# Patient Record
Sex: Female | Born: 1945 | Hispanic: Yes | Marital: Married | Smoking: Never smoker
Health system: Southern US, Community
[De-identification: ages and names within clinical notes are randomized; demographics above are authoritative.]

---

## 2016-10-18 ENCOUNTER — Encounter (INDEPENDENT_AMBULATORY_CARE_PROVIDER_SITE_OTHER): Payer: Self-pay | Admitting: *Deleted

## 2016-11-20 ENCOUNTER — Encounter (INDEPENDENT_AMBULATORY_CARE_PROVIDER_SITE_OTHER): Payer: Self-pay | Admitting: Internal Medicine

## 2016-11-20 ENCOUNTER — Encounter (INDEPENDENT_AMBULATORY_CARE_PROVIDER_SITE_OTHER): Payer: Self-pay

## 2016-12-21 ENCOUNTER — Ambulatory Visit (INDEPENDENT_AMBULATORY_CARE_PROVIDER_SITE_OTHER): Payer: Self-pay | Admitting: Internal Medicine

## 2016-12-26 ENCOUNTER — Encounter (INDEPENDENT_AMBULATORY_CARE_PROVIDER_SITE_OTHER): Payer: Self-pay | Admitting: Internal Medicine

## 2016-12-26 ENCOUNTER — Ambulatory Visit (INDEPENDENT_AMBULATORY_CARE_PROVIDER_SITE_OTHER): Payer: Medicare HMO | Admitting: Internal Medicine

## 2016-12-26 ENCOUNTER — Encounter (INDEPENDENT_AMBULATORY_CARE_PROVIDER_SITE_OTHER): Payer: Self-pay | Admitting: *Deleted

## 2016-12-26 VITALS — BP 130/78 | HR 66 | Temp 97.1°F | Ht 61.0 in | Wt 222.8 lb

## 2016-12-26 DIAGNOSIS — I10 Essential (primary) hypertension: Secondary | ICD-10-CM | POA: Diagnosis not present

## 2016-12-26 DIAGNOSIS — E785 Hyperlipidemia, unspecified: Secondary | ICD-10-CM

## 2016-12-26 DIAGNOSIS — F329 Major depressive disorder, single episode, unspecified: Secondary | ICD-10-CM | POA: Diagnosis not present

## 2016-12-26 DIAGNOSIS — F32A Depression, unspecified: Secondary | ICD-10-CM

## 2016-12-26 DIAGNOSIS — K219 Gastro-esophageal reflux disease without esophagitis: Secondary | ICD-10-CM

## 2016-12-26 DIAGNOSIS — R140 Abdominal distension (gaseous): Secondary | ICD-10-CM

## 2016-12-26 MED ORDER — SIMETHICONE 180 MG PO CAPS: 180.0000 mg | ORAL_CAPSULE | Freq: Two times a day (BID) | ORAL | 0 refills | Status: DC | PRN

## 2016-12-26 MED ORDER — PANTOPRAZOLE SODIUM 40 MG PO TBEC: 40.0000 mg | DELAYED_RELEASE_TABLET | Freq: Every day | ORAL | 5 refills | Status: DC

## 2016-12-26 NOTE — Patient Instructions (Signed)
Upper abdominal ultrasound to be scheduled. Will request records of prior EGD and colonoscopy for review.

## 2016-12-26 NOTE — Progress Notes (Signed)
Presenting complaint;  Frequent bloating and belching.  History of present illness:  Patient is 71 year old female who is referred through courtesy of Dr. Leana RoeStacy Lahti for GI evaluation. Patient is accompanied by her husband Bary CastillaKalil. History is provided by the patient with help her for husband. Patient presents with complaints of bloating which she's had for many years but it has gotten worse lately. She says she has had problems with heartburn and regurgitation for more than 10 years. She states she's never had satisfactory control. Lately she's been burping after every meal and in between. However she does not experience nausea or vomiting. She has tried OTC medication without any relief. She denies abdominal pain diarrhea constipation melena or rectal bleeding. She generally has 1 formed stool daily. She had EGD and colonoscopy by Dr. Elder CyphersShiflett about 5 years ago. EGD revealed some irritation involving gastric mucosa but colonoscopy was normal. She is presently on ranitidine and does not feel that it is helping. She states she has gained a few pounds this year. She is trying to be more active. She goes to [place] twice a week and 3 days a week she walks for at least 30 minutes each time.  Current Medications: Outpatient Encounter Prescriptions as of 12/26/2016  Medication Sig  . ALPRAZolam (NIRAVAM) 0.25 MG dissolvable tablet Take 0.25 mg by mouth as needed for anxiety.  . Calcium Carbonate (CALCIUM 600 PO) Take by mouth daily.  Marland Kitchen. escitalopram (LEXAPRO) 20 MG tablet Take 20 mg by mouth daily.  . fexofenadine (ALLEGRA) 180 MG tablet Take 180 mg by mouth daily.  Marland Kitchen. losartan-hydrochlorothiazide (HYZAAR) 100-12.5 MG tablet Take 1 tablet by mouth daily.  . Multiple Vitamins-Minerals (PRESERVISION AREDS PO) Take by mouth daily.  . Probiotic Product (PROBIOTIC PO) Take by mouth. Patient alternates taking medication. Will take 1 day , skip a day , then take again...  . ranitidine (ZANTAC) 150 MG tablet Take  150 mg by mouth daily.  . rosuvastatin (CRESTOR) 10 MG tablet Take 10 mg by mouth. Patient takes twice a week.  . warfarin (COUMADIN) 4 MG tablet Take 4 mg by mouth daily.   No facility-administered encounter medications on file as of 12/26/2016.    Past medical history: Hypertension. Hyperlipidemia. Chronic GERD. Last EGD about 5 years ago. Depression. Obesity. History of left DVT. Abdominoplasty over 40 years ago. Last screening colonoscopy was normal about 5 years ago.   Allergies: No Known Allergies  Family history: Father had dementia and died at age 71. Mother had hypertension and asthma and lived to be in her 5270s. She lost one brother of colon carcinoma at age 71. He was diagnosed at age 71. 2 brothers died of coronary artery disease. There were both in their early 70s. She lost a sister secondary to CAD at age 71. She has 2 sisters living.   Social history: She is married and a comp in by her husband today. She is originally [place] but has been living in [place] several years. They have 2 children. Their son is 71 years old and daughter [id number]. That in good health. She worked in [employer] in business for few years but she's been retired for [employer]. She has never smoked cigars and does not drink alcohol.   Physical examination: Blood pressure 130/78, pulse 66, temperature (!) 97.1 F (36.2 C), temperature source Oral, height 5\' 1"  (1.549 m), weight 222 lb 12.8 oz (101.1 kg). Patient is alert and in no acute distress. She has hearing impairment. Conjunctiva  is pink. Sclera is nonicteric Oropharyngeal mucosa is normal. No neck masses or thyromegaly noted. Cardiac exam with regular rhythm normal S1 and S2. No murmur or gallop noted. Lungs are clear to auscultation. Abdomen is full. With upper abdominal scar. On palpation abdomen is soft and nontender without organomegaly or masses. No LE edema or clubbing noted.  Labs/studies Results: No lab data  available for review.     Assessment:  #1. Chronic GERD. Symptoms are not well controlled with dietary measures and ranitidine. She will benefit from trial with PPI. If she does not respond will consider diagnostic EGD.  #2. Chronic bloating. Differential diagnoses includes aerophagia secondary to GERD or anxiety or she could also have small bowel bacterial overgrowth. She does not have any other symptoms to support this diagnosis. Also need to rule out biliary tract disease.  #3. She is deemed to be average risk for CRC because her brother had Olan cancer at age 28. She is up-to-date on screening for CRC.   Recommendations:  Will request records of prior EGD and colonoscopy for review. Request prior bloodwork for review. Discontinue ranitidine. Pantoprazole 40 mg by mouth every morning. Phazyme 180 mg by mouth twice a day when necessary. Upper abdominal ultrasound. Patient will return for follow-up visit in 2 months.

## 2016-12-27 DIAGNOSIS — F32A Depression, unspecified: Secondary | ICD-10-CM | POA: Insufficient documentation

## 2016-12-27 DIAGNOSIS — I10 Essential (primary) hypertension: Secondary | ICD-10-CM | POA: Insufficient documentation

## 2016-12-27 DIAGNOSIS — F329 Major depressive disorder, single episode, unspecified: Secondary | ICD-10-CM | POA: Insufficient documentation

## 2016-12-27 DIAGNOSIS — E785 Hyperlipidemia, unspecified: Secondary | ICD-10-CM | POA: Insufficient documentation

## 2017-01-02 ENCOUNTER — Ambulatory Visit (HOSPITAL_COMMUNITY): Payer: Medicare HMO

## 2017-02-13 ENCOUNTER — Telehealth (INDEPENDENT_AMBULATORY_CARE_PROVIDER_SITE_OTHER): Payer: Self-pay | Admitting: Internal Medicine

## 2017-02-13 NOTE — Telephone Encounter (Signed)
Patient's spouse, Bary Castilla called and stated that his wife has an appointment on October 30 with Dr. Karilyn Cota .  He stated that he was supposed to call with a progress report, he stated that she is in the same state, no change.  He stated that she is pregnant with gas.  [phone]

## 2017-02-14 NOTE — Telephone Encounter (Signed)
Progress Report for Dr.Rehman

## 2017-02-16 ENCOUNTER — Other Ambulatory Visit (INDEPENDENT_AMBULATORY_CARE_PROVIDER_SITE_OTHER): Payer: Self-pay | Admitting: Internal Medicine

## 2017-02-16 MED ORDER — METRONIDAZOLE 250 MG PO TABS: 250.0000 mg | ORAL_TABLET | Freq: Three times a day (TID) | ORAL | 0 refills | Status: DC

## 2017-02-16 NOTE — Telephone Encounter (Signed)
Call returned. Talked with patient's husband. Will treat her with metronidazole 250 mg by mouth 3 times a day for 2 weeks. Patient is on warfarin. She will decrease dose to 2 mg daily. She will have INR checked by Dr. Tasia Catchings next week. She keep daily symptom diary while on antibiotics. Patient has office visit in one month.

## 2017-03-20 ENCOUNTER — Encounter (INDEPENDENT_AMBULATORY_CARE_PROVIDER_SITE_OTHER): Payer: Self-pay

## 2017-03-20 ENCOUNTER — Ambulatory Visit (INDEPENDENT_AMBULATORY_CARE_PROVIDER_SITE_OTHER): Payer: Medicare HMO | Admitting: Internal Medicine

## 2017-03-20 ENCOUNTER — Encounter (INDEPENDENT_AMBULATORY_CARE_PROVIDER_SITE_OTHER): Payer: Self-pay | Admitting: Internal Medicine

## 2017-03-20 VITALS — BP 128/72 | HR 65 | Temp 97.6°F | Resp 18 | Ht 61.0 in | Wt 218.2 lb

## 2017-03-20 DIAGNOSIS — R140 Abdominal distension (gaseous): Secondary | ICD-10-CM | POA: Diagnosis not present

## 2017-03-20 DIAGNOSIS — K219 Gastro-esophageal reflux disease without esophagitis: Secondary | ICD-10-CM

## 2017-03-20 MED ORDER — PANTOPRAZOLE SODIUM 40 MG PO TBEC: 40.0000 mg | DELAYED_RELEASE_TABLET | Freq: Every day | ORAL | 5 refills | Status: DC

## 2017-03-20 NOTE — Progress Notes (Signed)
Presenting complaint;  Follow-up for bloating and GERD.  Subjective:  Patient is 71 year old female who is here for scheduled visit accompanied by her husband.  She was last seen on 12/26/2016.  She was given 2-week supply of metronidazole.  She states it did not make any difference to her bloating.  She states heartburn is much better when she is not having regurgitation like she was before.  Her husband states she has burping spells usually in the morning lasting for a few minutes.  Her husband states sometimes her burps have an odor.  She is watching her diet and trying to walk more.  She has lost 4 pounds since her last visit. Her bowels move daily.   Current Medications: Outpatient Encounter Prescriptions as of 03/20/2017  Medication Sig  . ALPRAZolam (NIRAVAM) 0.25 MG dissolvable tablet Take 0.25 mg by mouth as needed for anxiety.  . Calcium Carbonate (CALCIUM 600 PO) Take by mouth daily.  Marland Kitchen escitalopram (LEXAPRO) 20 MG tablet Take 20 mg by mouth daily.  . fexofenadine (ALLEGRA) 180 MG tablet Take 180 mg by mouth daily.  Marland Kitchen FLUZONE HIGH-DOSE 0.5 ML injection TO BE ADMINISTERED BY PHARMACIST FOR IMMUNIZATION  . losartan-hydrochlorothiazide (HYZAAR) 100-12.5 MG tablet Take 1 tablet by mouth daily.  . metroNIDAZOLE (FLAGYL) 250 MG tablet Take 1 tablet (250 mg total) by mouth 3 (three) times daily.  . Multiple Vitamins-Minerals (PRESERVISION AREDS PO) Take by mouth daily.  . pantoprazole (PROTONIX) 40 MG tablet Take 1 tablet (40 mg total) by mouth daily before breakfast.  . Probiotic Product (PROBIOTIC PO) Take by mouth. Patient alternates taking medication. Will take 1 day , skip a day , then take again...  . rosuvastatin (CRESTOR) 10 MG tablet Take 10 mg by mouth. Patient takes twice a week.  . Simethicone (PHAZYME) 180 MG CAPS Take 1 capsule (180 mg total) by mouth 2 (two) times daily as needed.  . warfarin (COUMADIN) 4 MG tablet Take 4 mg by mouth daily.   No facility-administered  encounter medications on file as of 03/20/2017.      Objective: Blood pressure 128/72, pulse 65, temperature 97.6 F (36.4 C), temperature source Oral, resp. rate 18, height 5\' 1"  (1.549 m), weight 218 lb 3.2 oz (99 kg). Patient is alert and in no acute distress. She has hearing impairment. Conjunctiva is pink. Sclera is nonicteric Oropharyngeal mucosa is normal. No neck masses or thyromegaly noted. Cardiac exam with regular rhythm normal S1 and S2. No murmur or gallop noted. Lungs are clear to auscultation. Abdomen is full.  On palpation it is soft and nontender without organomegaly or or masses.  Percussion note is normal. No LE edema or clubbing noted.   Assessment:  #1.  Chronic GERD.  She is doing better with therapy.  If she is successful in losing weight.  May consider changing dose to every other day on her next visit.  #2.  Bloating.  She did not have significant benefit with metronidazole.  Suspect bloating is primarily due to weight gain.  It is interesting to note that she had bloating over 5 years ago when she had a EGD and colonoscopy.  #3.  Her risk for CRC would appear to be average since her brother was in his 85s at the time of diagnosis.  Therefore she could wait another 5 years before next screening colonoscopy.   Plan:  Continue pantoprazole at 40 mg every morning. Prescription sent to her pharmacy. Patient counseled for the need to lose weight. Goal is  for her tolose over 10 pounds by the time of her next visit in 6 months.

## 2017-03-20 NOTE — Patient Instructions (Addendum)
Notify if burping gets worse. Goal for you is lose at least 12 pounds before next office visit in 6 months.

## 2017-09-25 ENCOUNTER — Ambulatory Visit (INDEPENDENT_AMBULATORY_CARE_PROVIDER_SITE_OTHER): Payer: Medicare PPO | Admitting: Internal Medicine

## 2017-09-25 ENCOUNTER — Encounter (INDEPENDENT_AMBULATORY_CARE_PROVIDER_SITE_OTHER): Payer: Self-pay | Admitting: Internal Medicine

## 2017-09-25 VITALS — BP 138/84 | HR 62 | Temp 97.9°F | Resp 18 | Ht 61.0 in | Wt 219.0 lb

## 2017-09-25 DIAGNOSIS — K219 Gastro-esophageal reflux disease without esophagitis: Secondary | ICD-10-CM

## 2017-09-25 MED ORDER — PANTOPRAZOLE SODIUM 40 MG PO TBEC: 40.0000 mg | DELAYED_RELEASE_TABLET | Freq: Every day | ORAL | 11 refills | Status: AC

## 2017-09-25 NOTE — Patient Instructions (Signed)
Call if pantoprazole stops working. 

## 2017-09-25 NOTE — Progress Notes (Signed)
Presenting complaint;  Follow-up for GERD.  Subjective:  Patient is 72 year old African-American female who is here for scheduled visit accompanied by her husband.  She was last seen on 03/20/2017.  She says she is doing much better.  She has had very little bloating since her last visit and she has not taken Phazyme.  She says pantoprazole is working and she rarely has heartburn.  Last time she had heartburn was when she ate pizza.  She denies dysphagia nausea vomiting abdominal pain melena or rectal bleeding.  Her bowels move daily.  She has not lost any weight since her last visit.  She states she did lose 5 pounds until over 2 months ago when she developed flu and developed breathing difficulty and had to be treated with antibiotic and prednisone.  She gained 5 pounds back. She is watching her diet and walking some.  She states she will lose at least 10 pounds by the time of next visit.  Current Medications: Outpatient Encounter Medications as of 09/25/2017  Medication Sig  . ALPRAZolam (NIRAVAM) 0.25 MG dissolvable tablet Take 0.25 mg by mouth as needed for anxiety.  . Calcium Carbonate (CALCIUM 600 PO) Take by mouth daily.  Marland Kitchen escitalopram (LEXAPRO) 20 MG tablet Take 20 mg by mouth daily.  . fexofenadine (ALLEGRA) 180 MG tablet Take 180 mg by mouth daily.  Marland Kitchen FLUZONE HIGH-DOSE 0.5 ML injection TO BE ADMINISTERED BY PHARMACIST FOR IMMUNIZATION  . losartan-hydrochlorothiazide (HYZAAR) 100-12.5 MG tablet Take 1 tablet by mouth daily.  . Multiple Vitamins-Minerals (PRESERVISION AREDS PO) Take by mouth daily.  . pantoprazole (PROTONIX) 40 MG tablet Take 1 tablet (40 mg total) by mouth daily before breakfast.  . Probiotic Product (PROBIOTIC PO) Take by mouth. Patient alternates taking medication. Will take 1 day , skip a day , then take again...  . rosuvastatin (CRESTOR) 10 MG tablet Take 10 mg by mouth. Patient takes twice a week.  . Simethicone (PHAZYME) 180 MG CAPS Take 1 capsule (180 mg total)  by mouth 2 (two) times daily as needed.  . warfarin (COUMADIN) 4 MG tablet Take 4 mg by mouth daily.   No facility-administered encounter medications on file as of 09/25/2017.      Objective: Blood pressure 138/84, pulse 62, temperature 97.9 F (36.6 C), temperature source Oral, resp. rate 18, height  (1.549 m), weight 219 lb (99.3 kg). Patient is alert and in no acute distress. Conjunctiva is pink. Sclera is nonicteric Oropharyngeal mucosa is normal. No neck masses or thyromegaly noted. Cardiac exam with regular rhythm normal S1 and S2. No murmur or gallop noted. Lungs are clear to auscultation. Abdomen is full but symmetrical soft and nontender with organomegaly or masses. No LE edema or clubbing noted.    Assessment:  #1.  GERD.  She is doing well with therapy.  If she is able to lose weight she may be able to decrease PPI dose to every other day or so.  Plan:  New prescription given for pantoprazole for 30 days with 11 refills. Patient encouraged to watch calorie intake and walk regularly. Unless she has problems she will return for office visit in 1 year.

## 2018-09-30 ENCOUNTER — Encounter (INDEPENDENT_AMBULATORY_CARE_PROVIDER_SITE_OTHER): Payer: Self-pay | Admitting: Internal Medicine

## 2018-09-30 ENCOUNTER — Encounter (INDEPENDENT_AMBULATORY_CARE_PROVIDER_SITE_OTHER): Payer: Self-pay | Admitting: *Deleted

## 2018-09-30 ENCOUNTER — Other Ambulatory Visit (INDEPENDENT_AMBULATORY_CARE_PROVIDER_SITE_OTHER): Payer: Self-pay | Admitting: *Deleted

## 2018-09-30 ENCOUNTER — Ambulatory Visit (INDEPENDENT_AMBULATORY_CARE_PROVIDER_SITE_OTHER): Payer: Medicare PPO | Admitting: Internal Medicine

## 2018-09-30 ENCOUNTER — Other Ambulatory Visit: Payer: Self-pay

## 2018-09-30 VITALS — BP 168/80 | HR 86 | Temp 99.0°F | Resp 18 | Ht 61.0 in | Wt 219.9 lb

## 2018-09-30 DIAGNOSIS — K219 Gastro-esophageal reflux disease without esophagitis: Secondary | ICD-10-CM | POA: Diagnosis not present

## 2018-09-30 DIAGNOSIS — R142 Eructation: Secondary | ICD-10-CM

## 2018-09-30 DIAGNOSIS — K580 Irritable bowel syndrome with diarrhea: Secondary | ICD-10-CM | POA: Diagnosis not present

## 2018-09-30 MED ORDER — LOPERAMIDE HCL 2 MG PO CAPS: 2.0000 mg | ORAL_CAPSULE | Freq: Every day | ORAL | 5 refills | Status: AC | PRN

## 2018-09-30 MED ORDER — SIMETHICONE 180 MG PO CAPS: 180.0000 mg | ORAL_CAPSULE | Freq: Three times a day (TID) | ORAL | 0 refills | Status: AC | PRN

## 2018-09-30 NOTE — Progress Notes (Signed)
Presenting complaint;  Follow-up for GERD. Burping and bloating.  Database and subjective:  Patient is 73 year old female who is here for scheduled visit.  She is accompanied by her husband. She was last seen in May 2019 and was doing well. Patient was seen by her primary care physician and complained of bloating and intractable burping.  Pantoprazole was stopped and she states her burping got worse and she has gone back on it 3 days ago and feels some better.  Regarding her burping she was also treated with metronidazole.  She took 10 days the first time and stopped after 5 days on second go around.  She reports no improvement. Her husband states that she burps numerous times during the daytime.  She does not burp when she is sleeping.  She does not experience heartburn or regurgitation.  She denies abdominal pain.  She states she had abdominoplasty 40 years ago and she wonders if her burping is anything to do with that surgery. Her bowels used to be regular but over the last 2 months she has had postprandial bowel movement and some of her stools are loose.  She denies rectal bleeding fever or chills.  She does not recall that diarrhea was any better when she was on metronidazole. She says she has gained few pounds because she has not been able to walk for the last 3 weeks because she pulled a muscle.  She has been taking baclofen.  She is to take her last pill today.  Her husband states that she has developed some rash over chest.  Her back pain is much better.   Current Medications: Outpatient Encounter Medications as of 09/30/2018  Medication Sig  . ALPRAZolam (NIRAVAM) 0.25 MG dissolvable tablet Take 0.25 mg by mouth as needed for anxiety.  . Calcium Carbonate (CALCIUM 600 PO) Take by mouth daily.  Marland Kitchen. escitalopram (LEXAPRO) 20 MG tablet Take 20 mg by mouth daily.  . fexofenadine (ALLEGRA) 180 MG tablet Take 180 mg by mouth daily.  Marland Kitchen. FLUZONE HIGH-DOSE 0.5 ML injection TO BE ADMINISTERED BY  PHARMACIST FOR IMMUNIZATION  . losartan-hydrochlorothiazide (HYZAAR) 100-12.5 MG tablet Take 1 tablet by mouth daily.  . Multiple Vitamins-Minerals (PRESERVISION AREDS PO) Take by mouth daily.  . pantoprazole (PROTONIX) 40 MG tablet Take 1 tablet (40 mg total) by mouth daily before breakfast.  . Probiotic Product (PROBIOTIC PO) Take by mouth. Patient alternates taking medication. Will take 1 day , skip a day , then take again...  . rosuvastatin (CRESTOR) 10 MG tablet Take 10 mg by mouth at bedtime. Patient takes twice a week.   . warfarin (COUMADIN) 4 MG tablet Take 4 mg by mouth daily.   No facility-administered encounter medications on file as of 09/30/2018.      Objective: Blood pressure (!) 168/80, pulse 86, temperature 99 F (37.2 C), temperature source Oral, resp. rate 18, height 5\' 1"  (1.549 m), weight 219 lb 14.4 oz (99.7 kg). Patient is alert and in no acute distress. Conjunctiva is pink. Sclera is nonicteric Oropharyngeal mucosa is normal. No neck masses or thyromegaly noted. Cardiac exam with regular rhythm normal S1 and S2. No murmur or gallop noted. Lungs are clear to auscultation. Abdomen is full and symmetrical.  She has a long upper midline scar which extends to below the level of umbilicus.  Bowel sounds are normal.  On palpation abdomen is soft.  Percussion note is normal.  No organomegaly or masses. No LE edema or clubbing noted.   Assessment:  #1.  Intractable burping.  It is unclear if this symptom is due to gastritis or pharyngeal reflux disease aerophagia or she has altered anatomy to her upper GI tract or small bowel.  Her burping is not associated with nausea vomiting or abdominal pain and it may turn out to be aerophagia as a likely etiology.  GERD could also be contributing to her burping.  She is some better since she went back on pantoprazole 3 days ago.  #2.  GERD.  She does not have typical symptoms.  Will leave her on pantoprazole for now.  It remains to be  seen if she has a large hiatal hernia.  She may eventually need pH study to determine if she is refluxing or not.  #3.  Bloating.  Abdominal exam does not reveal tympanic abdomen.  I wonder if bloating is due to weight issues.  #4. irritable bowel syndrome.  She has typical symptoms.  She states she had "stomach virus" few months ago.  Therefore IBS may be postinfectious and I believe it should gradually improve and in the meantime we will treat her symptomatically.  Plan:  Schedule upper GI series with small bowel follow-through. Imodium OTC 2 mg p.o. daily PRN. Phazyme 180 mg by mouth after each meal for 1 week and thereafter on as-needed basis if it helps with burping and bloating. Patient will stop baclofen. If her rash gets worse she will contact her primary care physician. Office visit in 6 months.

## 2018-09-30 NOTE — Patient Instructions (Addendum)
Imodium OTC 2 mg every daily  as needed. Take Phazyme 3 times a day for 1 week and thereafter on as-needed basis.

## 2018-10-21 ENCOUNTER — Ambulatory Visit (HOSPITAL_COMMUNITY)
Admission: RE | Admit: 2018-10-21 | Discharge: 2018-10-21 | Disposition: A | Discharge status: Home / Self Care | Payer: Medicare PPO | Source: Ambulatory Visit | Attending: Internal Medicine | Admitting: Internal Medicine

## 2018-10-21 ENCOUNTER — Other Ambulatory Visit: Payer: Self-pay

## 2018-10-21 DIAGNOSIS — R142 Eructation: Secondary | ICD-10-CM | POA: Diagnosis not present

## 2018-10-28 ENCOUNTER — Ambulatory Visit (INDEPENDENT_AMBULATORY_CARE_PROVIDER_SITE_OTHER): Payer: Medicare PPO | Admitting: Internal Medicine

## 2019-04-08 ENCOUNTER — Ambulatory Visit (INDEPENDENT_AMBULATORY_CARE_PROVIDER_SITE_OTHER): Payer: Medicare PPO | Admitting: Internal Medicine

## 2020-12-15 IMAGING — RF UPPER GI W/ SMALL BOWEL
13 series · 13 of 13 positions shown · non-contrast
Comparison: None

CLINICAL DATA: Intractable chronic burping, bloating

EXAM:
UPPER GI SERIES WITH SMALL BOWEL FOLLOW-THROUGH
FLUOROSCOPY TIME:  Fluoroscopy Time:  2 minutes 21 seconds
Radiation Exposure Index (if provided by the fluoroscopic device):
39.6 mGy
Number of Acquired Spot Images: 5 plus multiple fluoroscopic screen
captures
TECHNIQUE: Combined double contrast and single contrast upper GI series using
effervescent crystals, thick barium, and thin barium. Subsequently,
serial images of the small bowel were obtained including spot views
of the terminal ileum.

[Series 1: cp_standard · 0.27mm/px · 1 of 1 slices shown (1 of 13)]
[im 1/1]
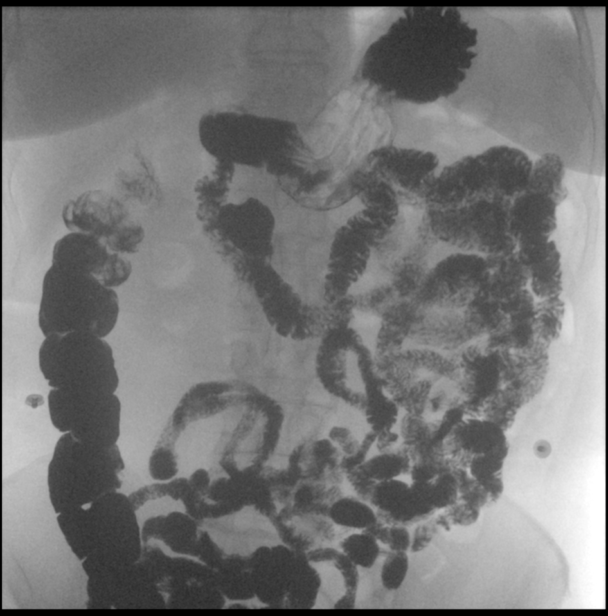

[Series 2: cp_standard · 0.27mm/px · 1 of 1 slices shown (2 of 13)]
[im 1/1]
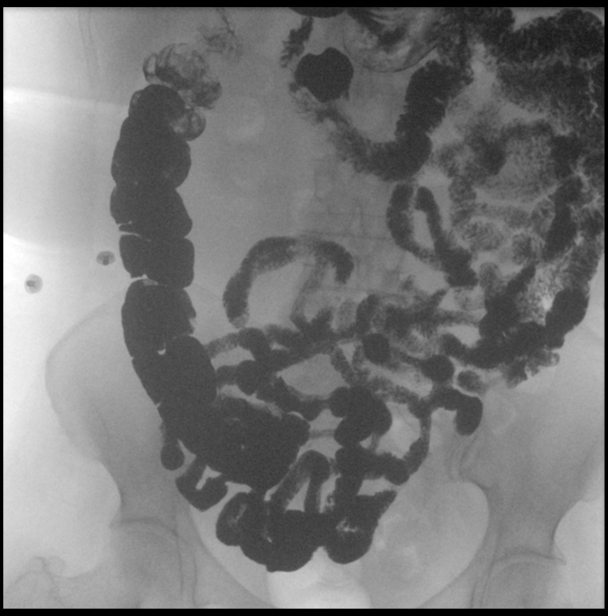

[Series 3: cp_standard · 0.18mm/px · 1 of 1 slices shown (3 of 13)]
[im 1/1]
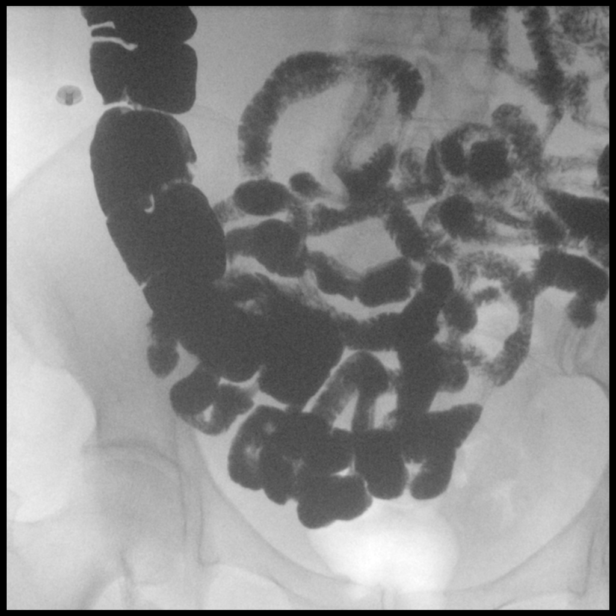

[Series 4: cp_standard · 0.18mm/px · 1 of 1 slices shown (4 of 13)]
[im 1/1]
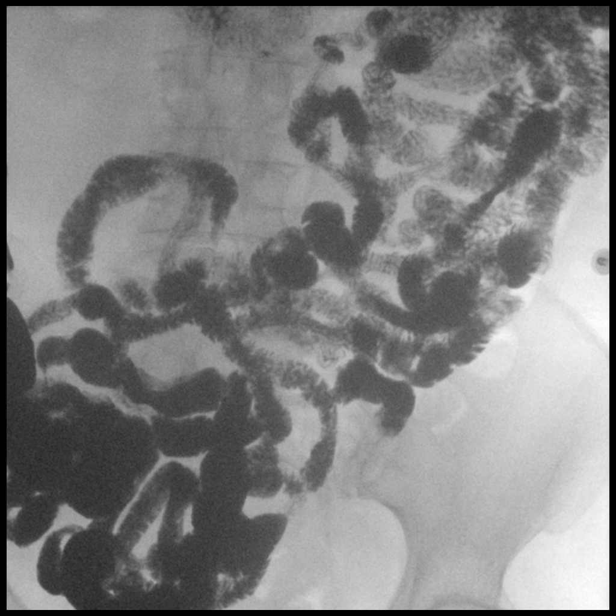

[Series 5: cp_standard · 0.18mm/px · 1 of 1 slices shown (5 of 13)]
[im 1/1]
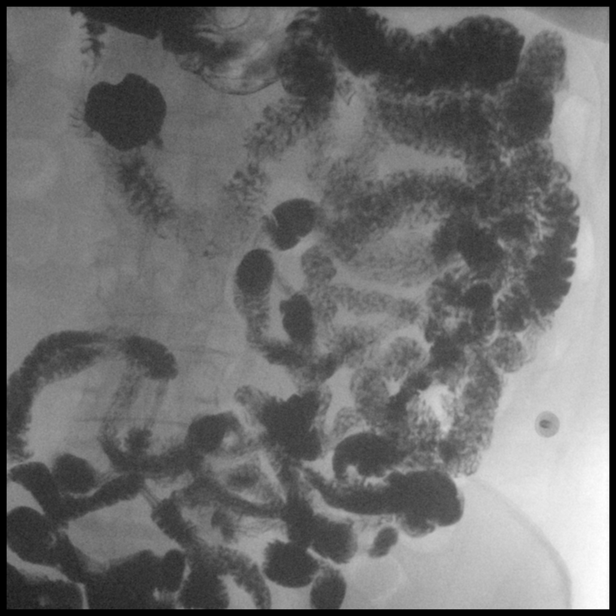

[Series 6: cp_standard · 0.18mm/px · 1 of 1 slices shown (6 of 13)]
[im 1/1]
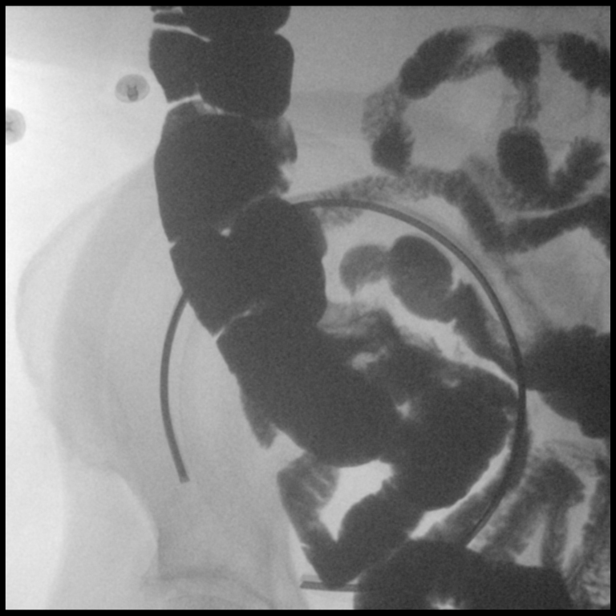

[Series 7: cp_standard · 0.18mm/px · 1 of 1 slices shown (7 of 13)]
[im 1/1]
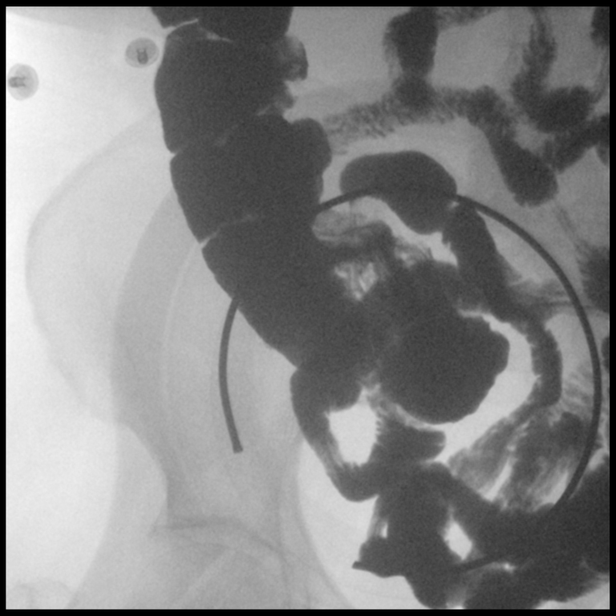

[Series 8: cp_standard · 0.18mm/px · 1 of 1 slices shown (8 of 13)]
[im 1/1]
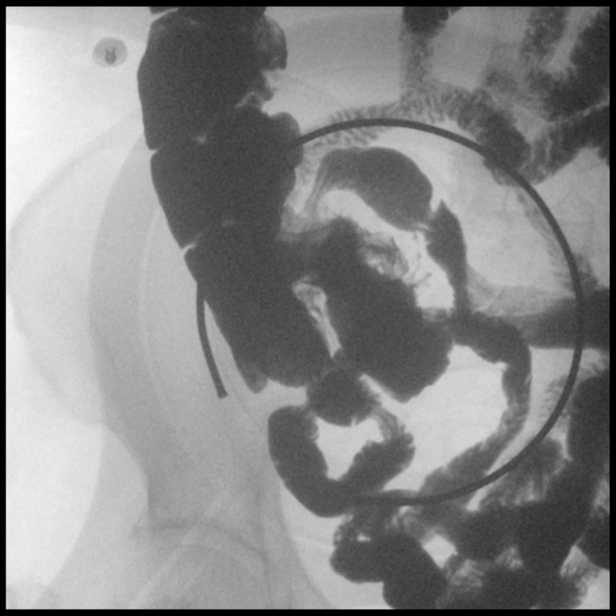

[Series 9: cp_standard · 0.18mm/px · 1 of 1 slices shown (9 of 13)]
[im 1/1]
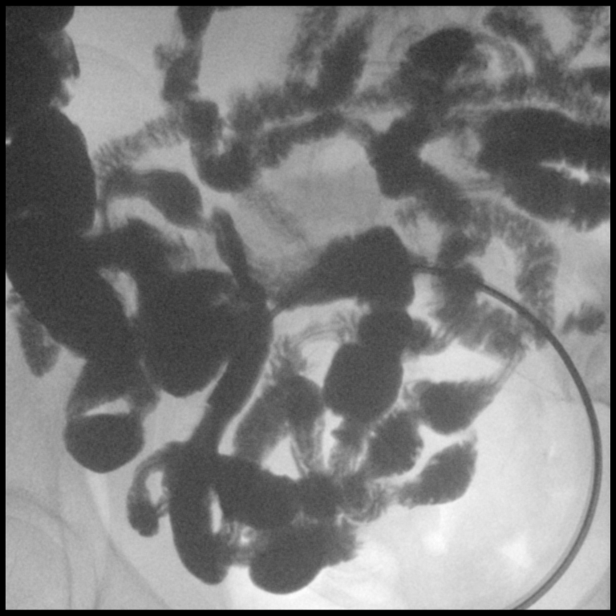

[Series 10: cp_standard · 0.18mm/px · 1 of 1 slices shown (10 of 13)]
[im 1/1]
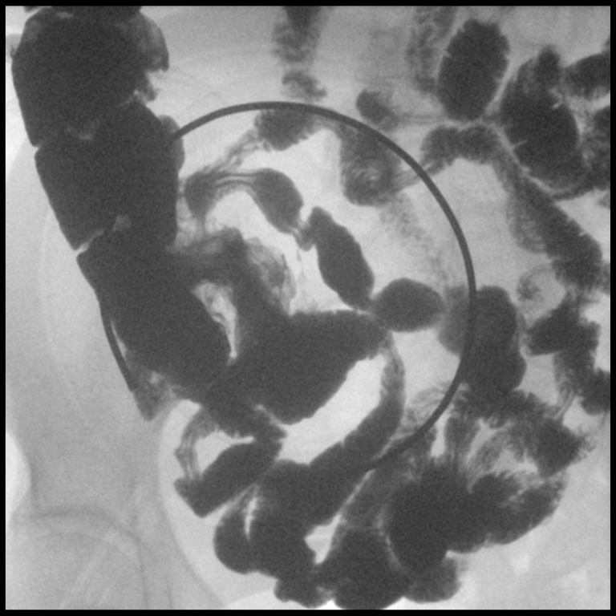

[Series 11: cp_standard · 0.18mm/px · 1 of 1 slices shown (11 of 13)]
[im 1/1]
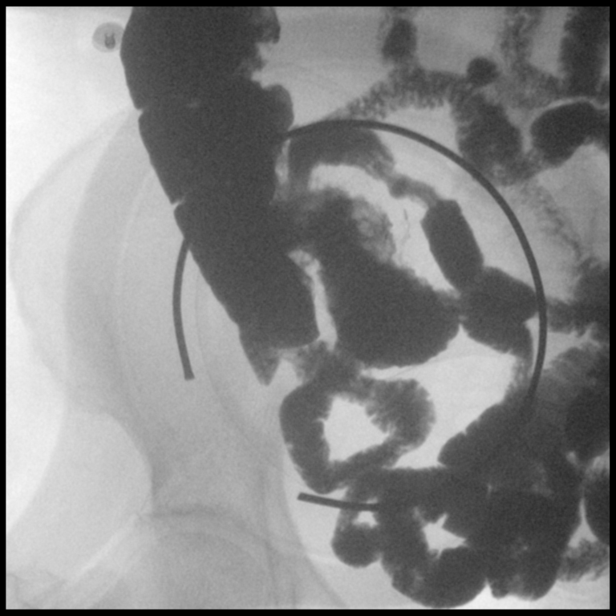

[Series 12: cp_standard · 0.18mm/px · 1 of 1 slices shown (12 of 13)]
[im 1/1]
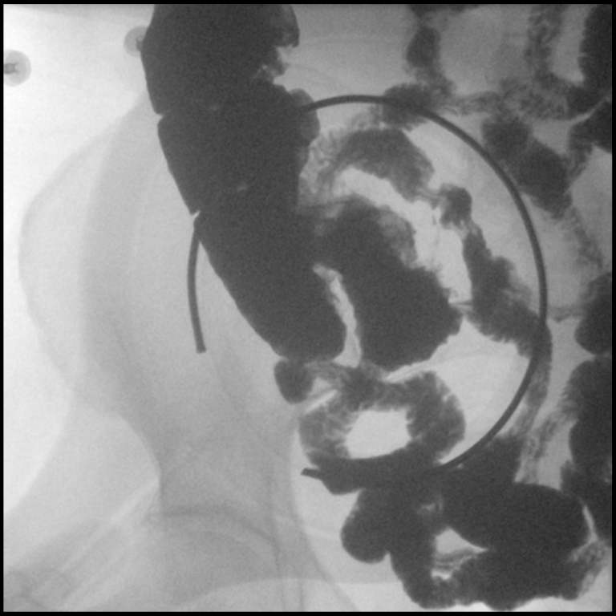

[Series 13: cp_standard · 0.18mm/px · 1 of 1 slices shown (13 of 13)]
[im 1/1]
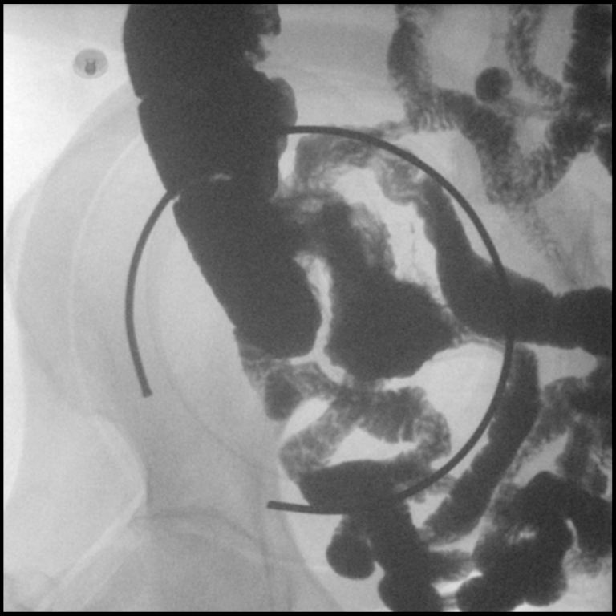

[13 of 13 positions shown; findings below may reference images not displayed]

FINDINGS: Normal bowel gas pattern on scout image.

Bones appear demineralized.

Normal esophageal distention and motility.

No esophageal mass or stricture.

Stomach distends normally with normal rugal fold pattern.

No gastric mass or ulceration.

No gastroesophageal reflux seen during exam.

Duodenal bulb and sweep normal appearance with incidentally noted
diverticulum at C loop.

Ligament of Treitz normal position.

Short segment of mucosal fold/wall thickening at terminal ileum
extending to ileocecal valve compatible with terminal ileitis.

Remaining jejunal and ileal loops demonstrate normal mucosal fold
pattern/thickness.

No additional areas of wall/mucosal fold thickening, dilatation, or
narrowing.

Visualized portion of RIGHT colon unremarkable.
IMPRESSION: Short segment of wall thickening of the terminal ileum extending to
ileocecal valve consistent with terminal ileitis; differential
diagnosis would include infection and inflammatory bowel disease.

Incidentally noted duodenal diverticulum at second portion of
duodenum.

Remainder of exam unremarkable.
